# Patient Record
Sex: Female | Born: 1990 | Race: White | Hispanic: No | Marital: Single | Smoking: Never smoker
Health system: Southern US, Community
[De-identification: ages and names within clinical notes are randomized; demographics above are authoritative.]

---

## 2017-06-26 ENCOUNTER — Emergency Department (INDEPENDENT_AMBULATORY_CARE_PROVIDER_SITE_OTHER)
Admission: EM | Admit: 2017-06-26 | Discharge: 2017-06-26 | Disposition: A | Discharge status: Home / Self Care | Payer: BLUE CROSS/BLUE SHIELD | Source: Home / Self Care | Attending: Family Medicine | Admitting: Family Medicine

## 2017-06-26 ENCOUNTER — Encounter: Payer: Self-pay | Admitting: *Deleted

## 2017-06-26 DIAGNOSIS — J020 Streptococcal pharyngitis: Secondary | ICD-10-CM

## 2017-06-26 LAB — POCT RAPID STREP A (OFFICE): Rapid Strep A Screen: POSITIVE — AB

## 2017-06-26 MED ORDER — AZITHROMYCIN 250 MG PO TABS: 250.0000 mg | ORAL_TABLET | Freq: Every day | ORAL | 0 refills | Status: DC

## 2017-06-26 NOTE — ED Triage Notes (Signed)
Patient c/o 1 week of cough, mostly dry, productive at times. Sore throat x 2-3 days. No otc meds taken. She is currently [redacted] weeks gestation.

## 2017-06-26 NOTE — Discharge Instructions (Signed)
°  You may take 500mg acetaminophen every 4-6 hours or in combination with ibuprofen 400-600mg every 6-8 hours as needed for pain, inflammation, and fever. ° °Be sure to drink at least eight 8oz glasses of water to stay well hydrated and get at least 8 hours of sleep at night, preferably more while sick.  ° °

## 2017-06-26 NOTE — ED Provider Notes (Signed)
[facility]    CSN: [id number] Arrival date & time: 06/26/17  0945     History   Chief Complaint Chief Complaint  Patient presents with  . Cough  . Sore Throat    HPI Maria Ryan is a 26 y.o. female.   HPI Maria Ryan is a 26 y.o. female presenting to UC with c/o 1 week of nasal congestion, minimally productive cough, and sore throat for 2-3 days.  Her step-son was dx with strep throat this weekend.  Her fiance has been sick recently with a viral illness. Denies fever, chills, n/v/d. No OTC medications today but she has taken mucinex with mild relief.  She is [redacted] weeks pregnant.    History reviewed. No pertinent past medical history.  There are no active problems to display for this patient.   History reviewed. No pertinent surgical history.  OB History    Gravida Para Term Preterm AB Living   1             SAB TAB Ectopic Multiple Live Births                   Home Medications    Prior to Admission medications   Medication Sig Start Date End Date Taking? Authorizing Provider  Prenatal Vit-Fe Fumarate-FA (MULTIVITAMIN-PRENATAL) 27-0.8 MG TABS tablet Take 1 tablet by mouth daily at 12 noon.   Yes [provider]  azithromycin (ZITHROMAX) 250 MG tablet Take 1 tablet (250 mg total) by mouth daily. Take first 2 tablets together, then 1 every day until finished. 06/26/17   Lurene Shadow, PA-C    Family History Family History  Problem Relation Age of Onset  . Hyperlipidemia Father   . Hypertension Father     Social History Social History  Substance Use Topics  . Smoking status: Never Smoker  . Smokeless tobacco: Never Used  . Alcohol use No     Allergies   Amoxicillin   Review of Systems Review of Systems  Constitutional: Negative for chills and fever.  HENT: Positive for congestion, rhinorrhea and sore throat. Negative for ear pain, postnasal drip, trouble swallowing and voice change.   Respiratory: Positive for cough.  Negative for shortness of breath.   Cardiovascular: Negative for chest pain and palpitations.  Gastrointestinal: Negative for abdominal pain, diarrhea, nausea and vomiting.  Musculoskeletal: Negative for arthralgias, back pain and myalgias.  Skin: Negative for rash.     Physical Exam Triage Vital Signs ED Triage Vitals [06/26/17 1011]  Enc Vitals Group     BP 102/66     Pulse Rate 97     Resp 16     Temp 98.6 F (37 C)     Temp Source Oral     SpO2 98 %     Weight 178 lb (80.7 kg)     Height      Head Circumference      Peak Flow      Pain Score 4     Pain Loc      Pain Edu?      Excl. in GC?    No data found.   Updated Vital Signs BP 102/66 (BP Location: Left Arm)   Pulse 97   Temp 98.6 F (37 C) (Oral)   Resp 16   Wt 178 lb (80.7 kg)   SpO2 98%      Physical Exam  Constitutional: She is oriented to person, place, and time. She appears well-developed and well-nourished. No  distress.  HENT:  Head: Normocephalic and atraumatic.  Right Ear: Tympanic membrane normal.  Left Ear: Tympanic membrane normal.  Nose: Nose normal.  Mouth/Throat: Uvula is midline and mucous membranes are normal. Posterior oropharyngeal erythema present. No oropharyngeal exudate, posterior oropharyngeal edema or tonsillar abscesses.  Eyes: EOM are normal.  Neck: Normal range of motion. Neck supple.  Cardiovascular: Normal rate and regular rhythm.   Pulmonary/Chest: Effort normal and breath sounds normal. No stridor. No respiratory distress. She has no wheezes. She has no rales.  Musculoskeletal: Normal range of motion.  Lymphadenopathy:    She has no cervical adenopathy.  Neurological: She is alert and oriented to person, place, and time.  Skin: Skin is warm and dry. She is not diaphoretic.  Psychiatric: She has a normal mood and affect. Her behavior is normal.  Nursing note and vitals reviewed.    UC Treatments / Results  Labs (all labs ordered are listed, but only abnormal  results are displayed) Labs Reviewed  POCT RAPID STREP A (OFFICE)    EKG  EKG Interpretation None       Radiology No results found.  Procedures Procedures (including critical care time)  Medications Ordered in UC Medications - No data to display   Initial Impression / Assessment and Plan / UC Course  I have reviewed the triage vital signs and the nursing notes.  Pertinent labs & imaging results that were available during my care of the patient were reviewed by me and considered in my medical decision making (see chart for details).     Rapid strep: POSITIVE Pt gets a rash with PCN. She is unsure if she has had cephalosporins before as she has not needed antibiotics "in a long time."  Will treat with azithromycin F/u with PCP in 1 week if not improving.   Final Clinical Impressions(s) / UC Diagnoses   Final diagnoses:  Strep pharyngitis    New Prescriptions New Prescriptions   AZITHROMYCIN (ZITHROMAX) 250 MG TABLET    Take 1 tablet (250 mg total) by mouth daily. Take first 2 tablets together, then 1 every day until finished.     Controlled Substance Prescriptions [place] Controlled Substance Registry consulted? Not Applicable   Lurene Shadowhelps, Cashtyn Pouliot O, PA-C 06/26/17 1042

## 2017-07-06 ENCOUNTER — Emergency Department (INDEPENDENT_AMBULATORY_CARE_PROVIDER_SITE_OTHER)
Admission: EM | Admit: 2017-07-06 | Discharge: 2017-07-06 | Disposition: A | Discharge status: Home / Self Care | Payer: BLUE CROSS/BLUE SHIELD | Source: Home / Self Care | Attending: Family Medicine | Admitting: Family Medicine

## 2017-07-06 ENCOUNTER — Other Ambulatory Visit: Payer: Self-pay

## 2017-07-06 DIAGNOSIS — J069 Acute upper respiratory infection, unspecified: Secondary | ICD-10-CM

## 2017-07-06 DIAGNOSIS — M940 Chondrocostal junction syndrome [Tietze]: Secondary | ICD-10-CM

## 2017-07-06 DIAGNOSIS — B9789 Other viral agents as the cause of diseases classified elsewhere: Secondary | ICD-10-CM | POA: Diagnosis not present

## 2017-07-06 LAB — POCT RAPID STREP A (OFFICE): Rapid Strep A Screen: NEGATIVE

## 2017-07-06 NOTE — ED Triage Notes (Signed)
Has had sratchy throat several days, dx with strep several weeks ago.  Took medication as prescribed.

## 2017-07-06 NOTE — Discharge Instructions (Signed)
Take plain guaifenesin (1200mg  extended release tabs such as Mucinex) twice daily, with plenty of water, for cough and congestion.  Get adequate rest.   Also recommend using saline nasal spray several times daily and saline nasal irrigation (AYR is a common brand).    Try warm salt water gargles for sore throat.  Stop all antihistamines for now, and other non-prescription cough/cold preparations. May take Tylenol as needed. May take Delsym Cough Suppressant at bedtime for nighttime cough.    Follow-up with family doctor if not improving about 7 to 10 days.

## 2017-07-06 NOTE — ED Provider Notes (Signed)
[facility]    CSN: [id number] Arrival date & time: 07/06/17  1110     History   Chief Complaint Chief Complaint  Patient presents with  . Sore Throat    HPI Maria Ryan is a 26 y.o. female.   Patient reports that her previous strep throat resolved. Patient complains of two day history of typical cold-like symptoms, including mild sore throat, sinus congestion, headache, fatigue, and cough.  She has felt hot.  She has had mild intermittent vertigo.  She complains of vague tightness over her sternum but no shortness of breath or wheezing. She is pregnant, due on September 03, 2017.  She has an OB follow-up on November 14.  No abdominal pain, vaginal bleeding, etc.   The history is provided by the patient.    History reviewed. No pertinent past medical history.  There are no active problems to display for this patient.   History reviewed. No pertinent surgical history.  OB History    Gravida Para Term Preterm AB Living   1             SAB TAB Ectopic Multiple Live Births                   Home Medications    Prior to Admission medications   Medication Sig Start Date End Date Taking? Authorizing Provider  Prenatal Vit-Fe Fumarate-FA (MULTIVITAMIN-PRENATAL) 27-0.8 MG TABS tablet Take 1 tablet by mouth daily at 12 noon.    [provider]    Family History Family History  Problem Relation Age of Onset  . Hyperlipidemia Father   . Hypertension Father     Social History Social History   Tobacco Use  . Smoking status: Never Smoker  . Smokeless tobacco: Never Used  Substance Use Topics  . Alcohol use: No  . Drug use: No     Allergies   Amoxicillin   Review of Systems Review of Systems + sore throat + cough No pleuritic pain, but has tightness over sternum. No wheezing + nasal congestion + post-nasal drainage No sinus pain/pressure No itchy/red eyes No earache + dizzy No hemoptysis No SOB No fever, + chills No  nausea No vomiting No abdominal pain No diarrhea No urinary symptoms No skin rash + fatigue + myalgias + headache Used OTC meds without relief   Physical Exam Triage Vital Signs ED Triage Vitals  Enc Vitals Group     BP 07/06/17 1141 108/70     Pulse Rate 07/06/17 1141 98     Resp --      Temp 07/06/17 1141 98.6 F (37 C)     Temp Source 07/06/17 1141 Oral     SpO2 07/06/17 1141 97 %     Weight 07/06/17 1142 180 lb (81.6 kg)     Height 07/06/17 1142 5\' 5"  (1.651 m)     Head Circumference --      Peak Flow --      Pain Score 07/06/17 1142 0     Pain Loc --      Pain Edu? --      Excl. in GC? --    No data found.  Updated Vital Signs BP 108/70 (BP Location: Right Arm)   Pulse 98   Temp 98.6 F (37 C) (Oral)   Ht 5\' 5"  (1.651 m)   Wt 180 lb (81.6 kg)   SpO2 97%   BMI 29.95 kg/m   Visual Acuity Right Eye Distance:  Left Eye Distance:   Bilateral Distance:    Right Eye Near:   Left Eye Near:    Bilateral Near:     Physical Exam Nursing notes and Vital Signs reviewed. Appearance:  Patient appears stated age, and in no acute distress Eyes:  Pupils are equal, round, and reactive to light and accomodation.  Extraocular movement is intact.  Conjunctivae are not inflamed  Ears:  Right canal occluded with cerumen.  Left canal normal.  Left tympanic membrane normal.  Nose:  Mildly congested turbinates.  No sinus tenderness.  Pharynx:  Normal Neck:  Supple.  Enlarged posterior/lateral nodes are palpated bilaterally, tender to palpation on the left.   Lungs:  Clear to auscultation.  Breath sounds are equal.  Moving air well. Chest:  Distinct tenderness to palpation over the mid-sternum.  Heart:  Regular rate and rhythm without murmurs, rubs, or gallops.  Abdomen:  Gravid.  Nontender without masses or hepatosplenomegaly.  Bowel sounds are present.  No CVA or flank tenderness.  Extremities:  No edema.  Skin:  No rash present.    UC Treatments / Results  Labs (all  labs ordered are listed, but only abnormal results are displayed) Labs Reviewed  POCT RAPID STREP A (OFFICE) negative    EKG  EKG Interpretation None       Radiology No results found.  Procedures Procedures (including critical care time)  Medications Ordered in UC Medications - No data to display   Initial Impression / Assessment and Plan / UC Course  I have reviewed the triage vital signs and the nursing notes.  Pertinent labs & imaging results that were available during my care of the patient were reviewed by me and considered in my medical decision making (see chart for details).    There is no evidence of bacterial infection today.   Treat symptomatically for now  Take plain guaifenesin (1200mg  extended release tabs such as Mucinex) twice daily, with plenty of water, for cough and congestion.  Get adequate rest.   Also recommend using saline nasal spray several times daily and saline nasal irrigation (AYR is a common brand).    Try warm salt water gargles for sore throat.  Stop all antihistamines for now, and other non-prescription cough/cold preparations. May take Tylenol as needed. May take Delsym Cough Suppressant at bedtime for nighttime cough.  Followup with OB as scheduled on 07/11/17. Follow-up with family doctor if not improving about 7 to 10 days.    Final Clinical Impressions(s) / UC Diagnoses   Final diagnoses:  Viral URI with cough  Costochondritis    ED Discharge Orders    None           Lattie HawBeese, Caoilainn Sacks A, MD 07/06/17 1203

## 2017-11-13 ENCOUNTER — Emergency Department
Admission: EM | Admit: 2017-11-13 | Discharge: 2017-11-13 | Disposition: A | Discharge status: Home / Self Care | Payer: Self-pay | Source: Home / Self Care

## 2017-11-13 ENCOUNTER — Other Ambulatory Visit: Payer: Self-pay

## 2017-11-13 ENCOUNTER — Encounter: Payer: Self-pay | Admitting: *Deleted

## 2017-11-13 DIAGNOSIS — J0300 Acute streptococcal tonsillitis, unspecified: Secondary | ICD-10-CM

## 2017-11-13 LAB — POCT RAPID STREP A (OFFICE): Rapid Strep A Screen: POSITIVE — AB

## 2017-11-13 MED ORDER — CEPHALEXIN 500 MG PO CAPS: 500.0000 mg | ORAL_CAPSULE | Freq: Four times a day (QID) | ORAL | 0 refills | Status: DC

## 2017-11-13 NOTE — Discharge Instructions (Signed)
Return if any problems.

## 2017-11-13 NOTE — ED Provider Notes (Signed)
[facility]    CSN: [id number] Arrival date & time: 11/13/17  1924     History   Chief Complaint Chief Complaint  Patient presents with  . Sore Throat    HPI Maria Ryan is a 27 y.o. female.   The history is provided by the patient. No language interpreter was used.  Sore Throat  This is a new problem. The problem occurs constantly. The problem has been gradually worsening. Nothing aggravates the symptoms. Nothing relieves the symptoms. She has tried nothing for the symptoms. The treatment provided no relief.  Pt complains of a sore throat.   History reviewed. No pertinent past medical history.  There are no active problems to display for this patient.   History reviewed. No pertinent surgical history.  OB History    Gravida Para Term Preterm AB Living   1             SAB TAB Ectopic Multiple Live Births                   Home Medications    Prior to Admission medications   Medication Sig Start Date End Date Taking? Authorizing Provider  cephALEXin (KEFLEX) 500 MG capsule Take 1 capsule (500 mg total) by mouth 4 (four) times daily. 11/13/17   Elson AreasSofia, Lenis Nettleton K, PA-C  Prenatal Vit-Fe Fumarate-FA (MULTIVITAMIN-PRENATAL) 27-0.8 MG TABS tablet Take 1 tablet by mouth daily at 12 noon.    [provider]    Family History Family History  Problem Relation Age of Onset  . Hyperlipidemia Father   . Hypertension Father   . Hypertension Mother     Social History Social History   Tobacco Use  . Smoking status: Never Smoker  . Smokeless tobacco: Never Used  Substance Use Topics  . Alcohol use: No  . Drug use: No     Allergies   Amoxicillin   Review of Systems Review of Systems  All other systems reviewed and are negative.    Physical Exam Triage Vital Signs ED Triage Vitals  Enc Vitals Group     BP 11/13/17 1944 129/83     Pulse Rate 11/13/17 1944 (!) 116     Resp 11/13/17 1944 16     Temp 11/13/17 1944 99.1 F (37.3 C)   Temp Source 11/13/17 1944 Oral     SpO2 11/13/17 1944 97 %     Weight 11/13/17 1945 160 lb (72.6 kg)     Height 11/13/17 1945 5\' 5"  (1.651 m)     Head Circumference --      Peak Flow --      Pain Score 11/13/17 1944 0     Pain Loc --      Pain Edu? --      Excl. in GC? --    No data found.  Updated Vital Signs BP 129/83 (BP Location: Right Arm)   Pulse (!) 116   Temp 99.1 F (37.3 C) (Oral)   Resp 16   Ht 5\' 5"  (1.651 m)   Wt 160 lb (72.6 kg)   SpO2 97%   Breastfeeding? Unknown   BMI 26.63 kg/m   Visual Acuity Right Eye Distance:   Left Eye Distance:   Bilateral Distance:    Right Eye Near:   Left Eye Near:    Bilateral Near:     Physical Exam  Constitutional: She appears well-developed and well-nourished. No distress.  HENT:  Head: Normocephalic and atraumatic.  Mouth/Throat: Mucous membranes are  normal. Posterior oropharyngeal erythema present.  Eyes: Conjunctivae are normal.  Neck: Neck supple.  Cardiovascular: Normal rate and regular rhythm.  No murmur heard. Pulmonary/Chest: Effort normal and breath sounds normal. No respiratory distress.  Abdominal: Soft. There is no tenderness.  Musculoskeletal: She exhibits no edema.  Neurological: She is alert.  Skin: Skin is warm and dry.  Psychiatric: She has a normal mood and affect.  Nursing note and vitals reviewed.    UC Treatments / Results  Labs (all labs ordered are listed, but only abnormal results are displayed) Labs Reviewed  POCT RAPID STREP A (OFFICE) - Abnormal; Notable for the following components:      Result Value   Rapid Strep A Screen Positive (*)    All other components within normal limits    EKG  EKG Interpretation None       Radiology No results found.  Procedures Procedures (including critical care time)  Medications Ordered in UC Medications - No data to display   Initial Impression / Assessment and Plan / UC Course  I have reviewed the triage vital signs and the  nursing notes.  Pertinent labs & imaging results that were available during my care of the patient were reviewed by me and considered in my medical decision making (see chart for details).     Strep is positive.  Pt reports rash with amox.  Pt is breast feeding.  I will treat with keflex.  Final Clinical Impressions(s) / UC Diagnoses   Final diagnoses:  Strep tonsillitis    ED Discharge Orders        Ordered    cephALEXin (KEFLEX) 500 MG capsule  4 times daily     11/13/17 1951     An After Visit Summary was printed and given to the patient.   Controlled Substance Prescriptions [place] Controlled Substance Registry consulted? Not Applicable   Elson Areas, [place] 11/13/17 1954

## 2017-11-13 NOTE — ED Triage Notes (Signed)
Pt c/o sore throat, body aches and runny nose x 2 days. Denies fever or cough.

## 2018-06-08 ENCOUNTER — Other Ambulatory Visit: Payer: Self-pay

## 2018-06-08 ENCOUNTER — Emergency Department (INDEPENDENT_AMBULATORY_CARE_PROVIDER_SITE_OTHER): Payer: Self-pay

## 2018-06-08 ENCOUNTER — Emergency Department (INDEPENDENT_AMBULATORY_CARE_PROVIDER_SITE_OTHER)
Admission: EM | Admit: 2018-06-08 | Discharge: 2018-06-08 | Disposition: A | Discharge status: Home / Self Care | Payer: Self-pay | Source: Home / Self Care | Attending: Emergency Medicine | Admitting: Emergency Medicine

## 2018-06-08 DIAGNOSIS — M25511 Pain in right shoulder: Secondary | ICD-10-CM

## 2018-06-08 DIAGNOSIS — S46911A Strain of unspecified muscle, fascia and tendon at shoulder and upper arm level, right arm, initial encounter: Secondary | ICD-10-CM

## 2018-06-08 LAB — POCT URINE PREGNANCY: Preg Test, Ur: NEGATIVE

## 2018-06-08 NOTE — Discharge Instructions (Signed)
X-ray right shoulder is negative for fracture or dislocation. Your shoulder pain is likely from muscle strain from repetitive lifting with right arm.-I would advise wearing the sling/shoulder immobilizer over the next week, but take the sling off a few hours a time when you are resting or if you are doing range of motion exercises.  -See attached instructions for range of motion exercises . may use Tylenol or ibuprofen or apply heat if needed for pain. I would expect you to be better in a week, but if not, follow-up with your PCP or orthopedist.

## 2018-06-08 NOTE — ED Triage Notes (Signed)
Pt c/o RT shoulder pain x 2 weeks. Hurts to lift arm higher than shoulder. Also painful to lift heavier things. Has tried ice/heat and ibuprofen with no relief.

## 2018-06-08 NOTE — ED Provider Notes (Signed)
[facility]    CSN: [id number] Arrival date & time: 06/08/18  1159     History   Chief Complaint Chief Complaint  Patient presents with  . Shoulder Pain    HPI Maria Ryan is a 27 y.o. female.   HPI Recalls no specific injury, except she frequently lifts her 71-month-old baby and car seat with right arm and right upper extremity.  She complains of 3 weeks of soreness and tightness right anterior shoulder without radiation.  5 out of 10 intensity of pain.  Tried ice and heat and ibuprofen and that helped a little.  No associated chest pain or shortness of breath or neck pain.  No numbness or focal weakness.  If she tries to raise right arm, that reproduces the right shoulder pain. History reviewed. No pertinent past medical history. Past medical history negative.  She is unsure of LMP.  She is breast-feeding/lactating. There are no active problems to display for this patient.   History reviewed. No pertinent surgical history.  OB History    Gravida  1   Para      Term      Preterm      AB      Living        SAB      TAB      Ectopic      Multiple      Live Births               Home Medications    Prior to Admission medications   Medication Sig Start Date End Date Taking? Authorizing Provider  Prenatal Vit-Fe Fumarate-FA (MULTIVITAMIN-PRENATAL) 27-0.8 MG TABS tablet Take 1 tablet by mouth daily at 12 noon.    [provider]    Family History Family History  Problem Relation Age of Onset  . Hyperlipidemia Father   . Hypertension Father   . Hypertension Mother    Negative for musculoskeletal problems in the family Social History Social History   Tobacco Use  . Smoking status: Never Smoker  . Smokeless tobacco: Never Used  Substance Use Topics  . Alcohol use: No  . Drug use: No   Does not smoke or drink or use drugs  Allergies   Amoxicillin   Review of Systems Review of Systems  All other systems reviewed  and are negative.    Physical Exam Triage Vital Signs ED Triage Vitals [06/08/18 1251]  Enc Vitals Group     BP 120/79     Pulse Rate 73     Resp      Temp 97.8 F (36.6 C)     Temp Source Oral     SpO2 100 %     Weight 167 lb (75.8 kg)     Height 5\' 6"  (1.676 m)     Head Circumference      Peak Flow      Pain Score 5     Pain Loc      Pain Edu?      Excl. in GC?    No data found.  Updated Vital Signs BP 120/79 (BP Location: Right Arm)   Pulse 73   Temp 97.8 F (36.6 C) (Oral)   Ht 5\' 6"  (1.676 m)   Wt 75.8 kg   LMP 05/15/2018 (Approximate)   SpO2 100%   BMI 26.95 kg/m   Visual Acuity Right Eye Distance:   Left Eye Distance:   Bilateral Distance:    Right Eye Near:  Left Eye Near:    Bilateral Near:     Physical Exam  Constitutional: She is oriented to person, place, and time. She appears well-developed and well-nourished. No distress.  HENT:  Head: Normocephalic and atraumatic.  Eyes: Pupils are equal, round, and reactive to light. No scleral icterus.  Neck: Normal range of motion. Neck supple.  Cardiovascular: Normal rate and regular rhythm.  Pulmonary/Chest: Effort normal.  Abdominal: She exhibits no distension.  Neurological: She is alert and oriented to person, place, and time. No cranial nerve deficit.  Skin: Skin is warm and dry.  Psychiatric: She has a normal mood and affect. Her behavior is normal.  Vitals reviewed.  Right shoulder: She points to the deltoid area and she is diffusely tender over right deltoid.  Minimally tender posterior aspect right shoulder.  Range of motion decreased to abduction, can abduct to 100 degrees with pain.  Empty can sign mildly positive on right.  Nontender over clavicle.  No bony deformity.  No open wound.  No instability  Neck: Normal.  No C-spine tenderness or deformity.  Range of motion intact.  Neuro: No sensory or motor deficit right upper extremity. Neurovascular intact right hand. Right elbow,  forearm, wrist, hand: Nontender.  Normal range of motion.  Normal exam.  Neurovascular intact.  UC Treatments / Results  Labs (all labs ordered are listed, but only abnormal results are displayed) Labs Reviewed  POCT URINE PREGNANCY   Urine pregnancy test negative EKG None  Radiology Dg Shoulder Right  Result Date: 06/08/2018 CLINICAL DATA:  Pt c/o right shoulder pain x 3 weeks. Denies injury. Pain increases with ROM and when lifting heavy objects. Pain is lateral. EXAM: RIGHT SHOULDER - 2+ VIEW COMPARISON:  None. FINDINGS: There is no evidence of fracture or dislocation. There is no evidence of arthropathy or other focal bone abnormality. Soft tissues are unremarkable. IMPRESSION: Negative. Electronically Signed   By: Corlis Leak M.D.   On: 06/08/2018 13:57    Procedures Procedures (including critical care time)  Medications Ordered in UC Medications - No data to display  Initial Impression / Assessment and Plan / UC Course  I have reviewed the triage vital signs and the nursing notes.  Pertinent labs & imaging results that were available during my care of the patient were reviewed by me and considered in my medical decision making (see chart for details).    X-ray right shoulder negative Discussed with patient.  Advised avoiding repetitive heavy lifting right upper extremity. Right shoulder sling applied. Range of motion exercises.  Ibuprofen and other symptomatic care Follow-up with PCP or orthopedist if no better 1 week, sooner if worse.  She voiced understanding. An After Visit Summary was printed and given to the patient.   Final Clinical Impressions(s) / UC Diagnoses   Final diagnoses:  Strain of right shoulder, initial encounter     Discharge Instructions     X-ray right shoulder is negative for fracture or dislocation. Your shoulder pain is likely from muscle strain from repetitive lifting with right arm.-I would advise wearing the sling/shoulder immobilizer  over the next week, but take the sling off a few hours a time when you are resting or if you are doing range of motion exercises.  -See attached instructions for range of motion exercises . may use Tylenol or ibuprofen or apply heat if needed for pain. I would expect you to be better in a week, but if not, follow-up with your PCP or orthopedist.  ED Prescriptions    None     Controlled Substance Prescriptions [place] Controlled Substance Registry consulted? Not Applicable   Lajean Manes, MD 06/08/18 1458

## 2019-06-16 IMAGING — DX DG SHOULDER 2+V*R*
3 series · 3 of 3 positions shown · non-contrast
Comparison: None.

CLINICAL DATA: Pt c/o right shoulder pain x 3 weeks. Denies injury.
Pain increases with ROM and when lifting heavy objects. Pain is
lateral.

EXAM:
RIGHT SHOULDER - 2+ VIEW

[shoulder grashey]
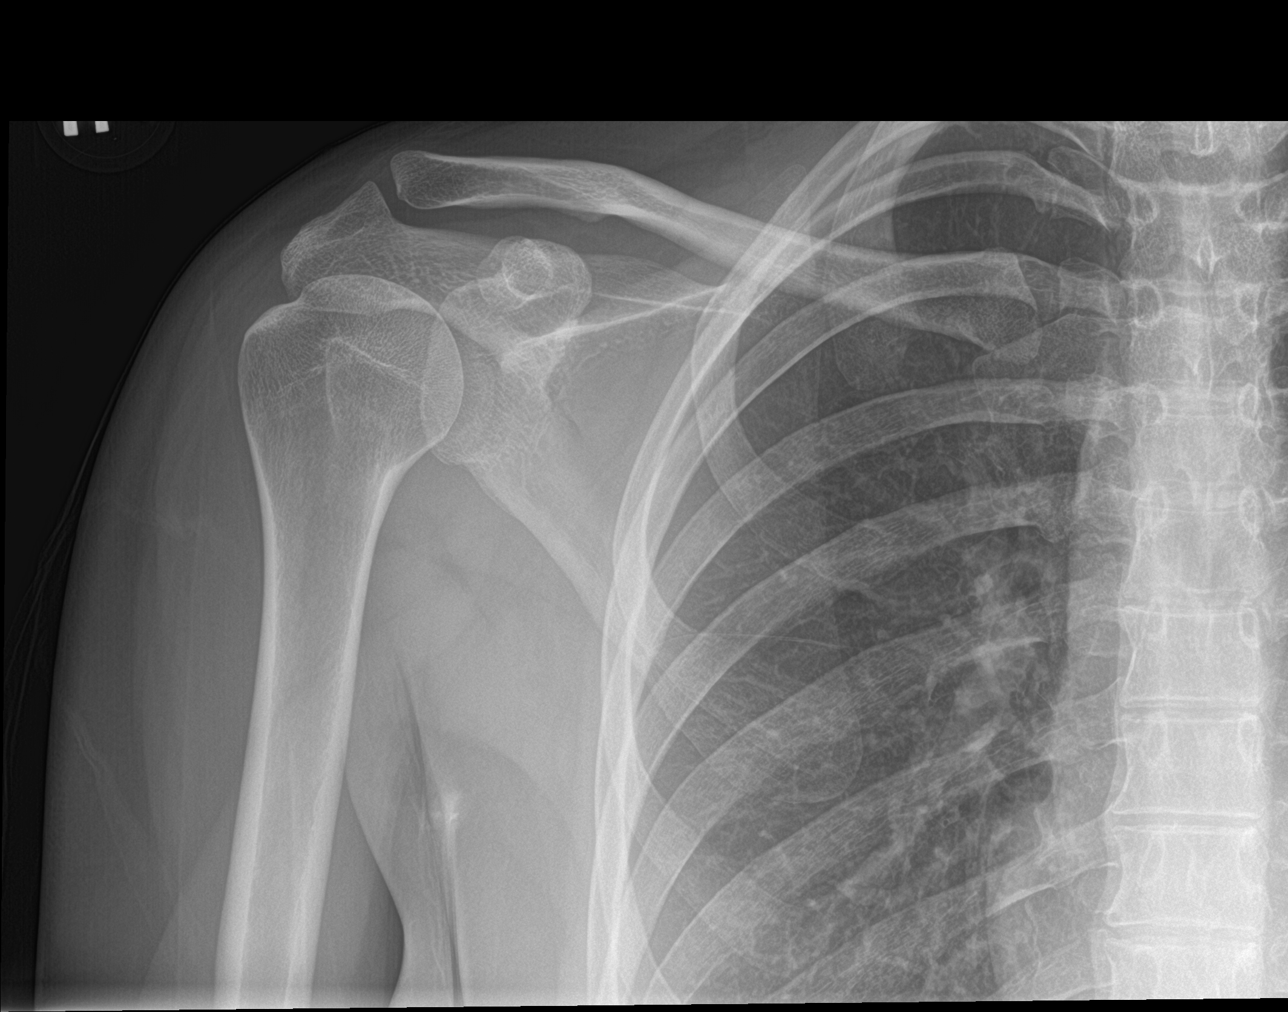

[shoulder y view]
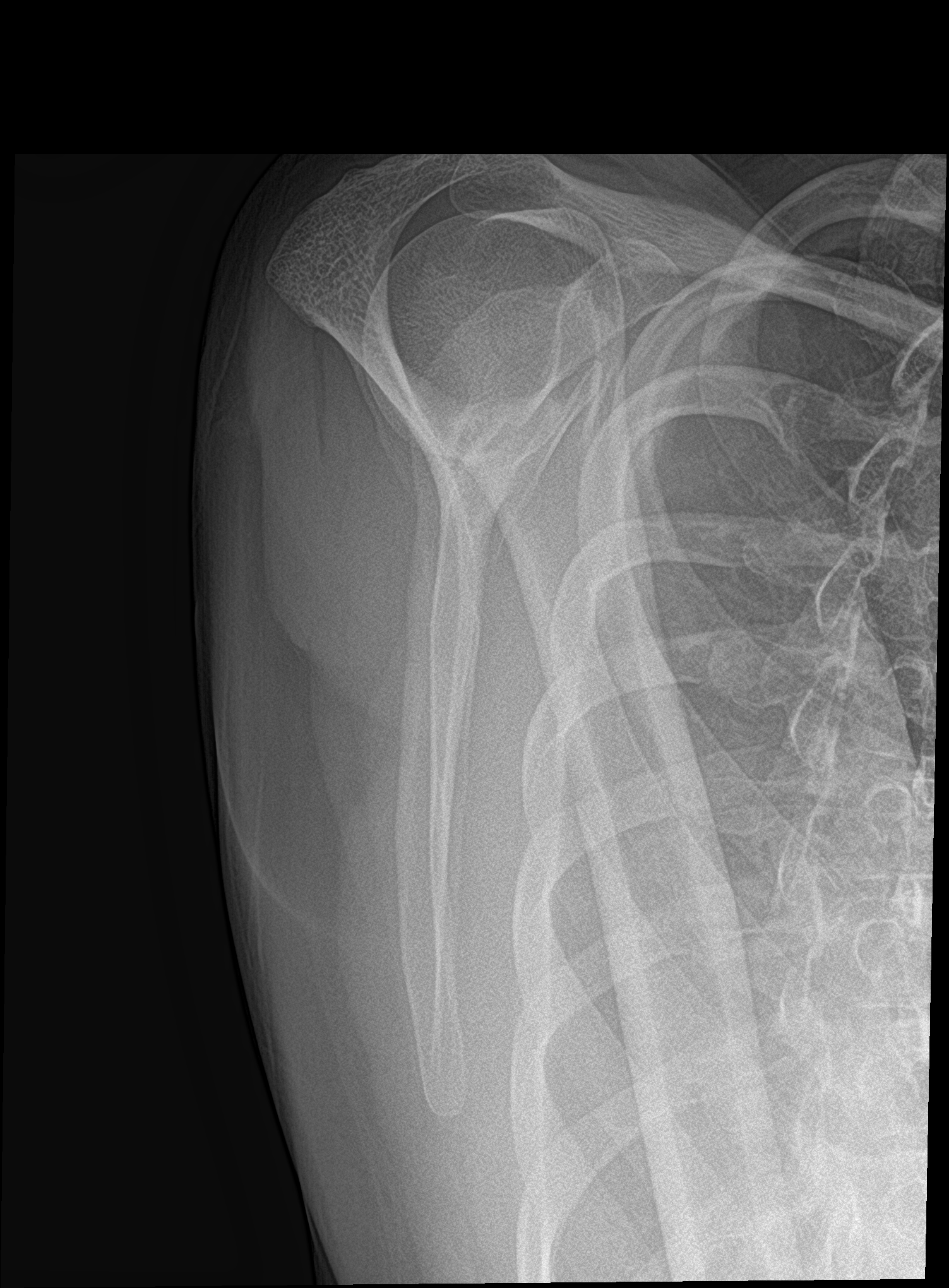

[shoulder axillary]
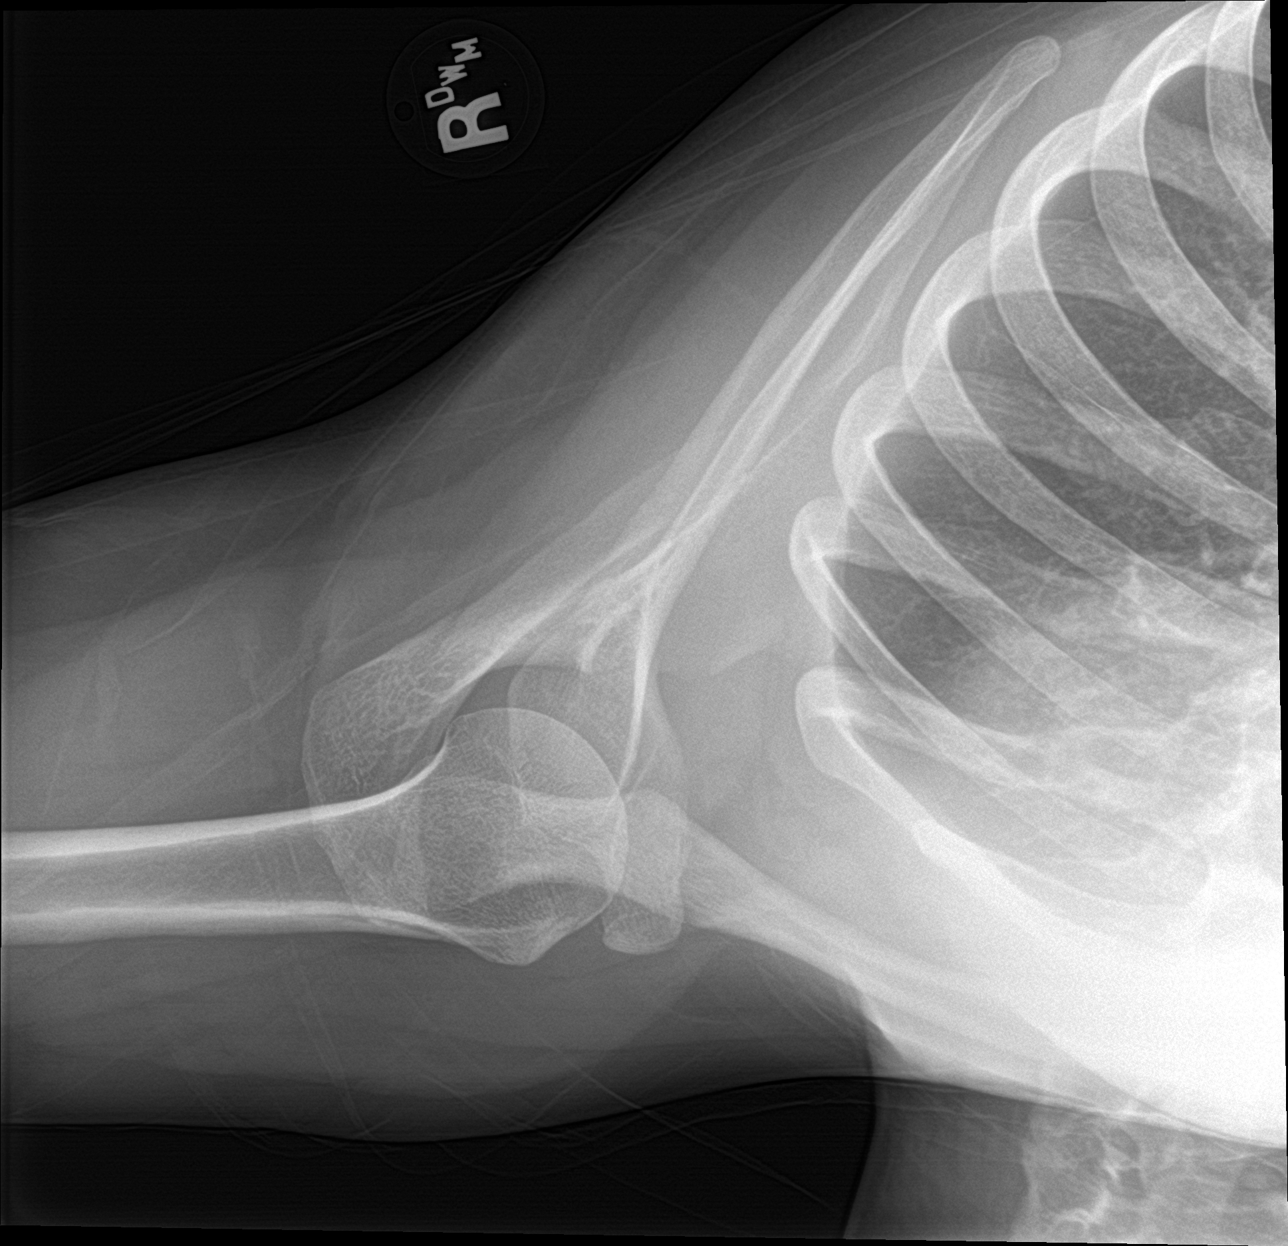

[3 of 3 positions shown; findings below may reference images not displayed]

FINDINGS: There is no evidence of fracture or dislocation. There is no
evidence of arthropathy or other focal bone abnormality. Soft
tissues are unremarkable.
IMPRESSION: Negative.
